# Patient Record
Sex: Male | Born: 1970 | Race: White | Hispanic: No | Marital: Married | Smoking: Never smoker
Health system: Southern US, Community
[De-identification: ages and names within clinical notes are randomized; demographics above are authoritative.]

---

## 2013-07-17 ENCOUNTER — Ambulatory Visit (INDEPENDENT_AMBULATORY_CARE_PROVIDER_SITE_OTHER): Payer: 59 | Admitting: Sports Medicine

## 2013-07-17 ENCOUNTER — Encounter: Payer: Self-pay | Admitting: Sports Medicine

## 2013-07-17 VITALS — BP 116/66 | Ht 71.0 in | Wt 155.0 lb

## 2013-07-17 DIAGNOSIS — M629 Disorder of muscle, unspecified: Secondary | ICD-10-CM

## 2013-07-17 DIAGNOSIS — M7632 Iliotibial band syndrome, left leg: Secondary | ICD-10-CM

## 2013-07-17 NOTE — Patient Instructions (Signed)
  It was nice meeting you.  You need to increase your hip abductor strength. Do 3 sets of 30 reps daily. When that gets easy, start adding ankle weights.  You may benefit from some iontophoresis(discuss this with your friend)  Start performing line drills a couple times a week running about 1 mile each time  See me again in 4 weeks

## 2013-07-18 NOTE — Progress Notes (Signed)
   Subjective:    Patient ID: Nathan Gibson, male    DOB: 06/01/1971, 43 y.o.   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 No injury that he can recall but rather sudden onset of sharp pain. Since that time he has had intermittent pain particularly with running. He states that he is able to get about three quarters of a mile into a run before his pain returns. It improves with relative rest. He has been working with a friend of his, who is a physical therapist, on some quad strengthening and IT band stretches. No problems with his knee in the past. In fact he was able to train for an complete a full marathon just before his knee pain started. He denies any swelling. No mechanical symptoms. No prior knee surgeries. Knee does sometimes get stiff with sitting.  Otherwise healthy. No known drug allergies. Takes no chronic medications.    Review of Systems     Objective:   Physical Exam Well-developed, fit-appearing. No acute distress.  Left knee: Full range of motion. No effusion. No patellofemoral crepitus. There is tenderness to palpation along the distal IT band with hip abductor weakness against resistance. No joint line tenderness to palpation. Negative McMurray's. Negative Thessaly's. Knee is stable to valgus and varus stressing. Negative anterior drawer, negative posterior drawer. No leg length discrepancy. Neurovascularly intact distally.  Evaluation of his running form shows him to slightly externally rotate his foot on foot strike. He is running without a limp.       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 I think he would benefit from some iontophoresis and he will discuss this with his physical therapy friend. I also want him to start performing some line  drills to help with his running form. He will do these twice weekly. He is okay to continue running as symptoms allow but he may need to cross train for the next several weeks if his pain is severe. Followup with me in 4 weeks for reevaluation.

## 2013-08-19 ENCOUNTER — Encounter: Payer: Self-pay | Admitting: Sports Medicine

## 2013-08-19 ENCOUNTER — Ambulatory Visit (INDEPENDENT_AMBULATORY_CARE_PROVIDER_SITE_OTHER): Payer: 59 | Admitting: Sports Medicine

## 2013-08-19 VITALS — BP 101/63 | Ht 71.0 in | Wt 160.0 lb

## 2013-08-19 DIAGNOSIS — M7630 Iliotibial band syndrome, unspecified leg: Secondary | ICD-10-CM

## 2013-08-19 DIAGNOSIS — M629 Disorder of muscle, unspecified: Secondary | ICD-10-CM

## 2013-08-19 NOTE — Progress Notes (Signed)
Patient ID: Nathan ChuRobert JAY Gibson, male   DOB: 06/04/1971, 43 y.o.   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  He has been doing hip abductor strengthening exercises almost daily.  When he forgets to do them and gets knee pain, he remembers to stop and doing the exercises and the pain quickly subsides.  He has not been doing IT band stretches, because he lost the paper that shows them.  He has been doing line drills intermittently.  He reports that when out for a run if his L knee starts to hurt, he will seek out a line to run on and this makes the pain subside almost instantaneously.  He reports that he is ~85-90% better from last time.  He is running a half marathon this weekend and a full marathon in May.  PE: Left knee: Full range of motion. No effusion. No TTP over IT band.  Some tightness in IT band remains.  Hip abductor strength is improved with 4+/5 strength in L hip abductors.  Patient still everts feet bilaterally while running. Walking and running without a limp.  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

## 2016-01-13 ENCOUNTER — Ambulatory Visit (INDEPENDENT_AMBULATORY_CARE_PROVIDER_SITE_OTHER): Payer: 59 | Admitting: Sports Medicine

## 2016-01-13 ENCOUNTER — Encounter: Payer: Self-pay | Admitting: Sports Medicine

## 2016-01-13 VITALS — BP 110/60 | Ht 71.0 in | Wt 155.0 lb

## 2016-01-13 DIAGNOSIS — M7662 Achilles tendinitis, left leg: Secondary | ICD-10-CM | POA: Diagnosis not present

## 2016-01-13 DIAGNOSIS — M79673 Pain in unspecified foot: Secondary | ICD-10-CM

## 2016-01-13 NOTE — Progress Notes (Signed)
   Subjective:    Patient ID: Nathan Gibson, male    DOB: 06/09/1971, 45 y.o.   MRN: [id number]  HPI chief complaint: Left Achilles pain  Patient comes in today complaining of 2 months of left Achilles pain. He does not recall a specific injury that injured the Achilles tendon but he did roll his ankle 2-3 days prior to noticing pain. His pain is worse with running, particularly up and down hills. He has noticed a mild amount of swelling. He localizes all of his pain to the Achilles. He has been able to run but decided to take a few days off to see if it would help his pain and he found that it did help somewhat. He has also been given a tube of Voltaren gel by a friend and is asking whether or not that may be helpful. He denies any problems with his Achilles in the past. He denies any numbness or tingling. No pain at rest. He typically runs in a zero drop shoe but he has recently purchased a regular well cushioned running shoe has more of a heel lift.  Interim medical history reviewed Medications reviewed Allergies reviewed    Review of Systems As above    Objective:   Physical Exam  Well-developed, fit appearing. No acute distress. Awake alert and oriented 3. Vital signs reviewed  Left Achilles: He is tender to palpation along the mid substance of the tendon. There is no swelling. No tenderness to palpation anterior to the Achilles tendon nor at the calcaneus. Some mild pain with Achilles stretch. Neurovascular intact distally. Walking without a limp.  MSK ultrasound of the left Achilles was performed. There is some mild thickening but no obvious tearing. No significant fluid.      Assessment & Plan:   Left Achilles strain  Patient will avoid his zero drop tennis shoes until he has made a full recovery. I did explain to him that this type of running shoe may place him at a risk of reinjury. I've given him a pair of 3/16 inch left to put in his new running shoes and he is okay to  run using pain as his guide but he will avoid hills as much as possible. I have given him some Alfredson heel drop exercises to start doing daily. He may use his topical Voltaren as needed. He understands that it may be 6-8 weeks before complete recovery. As long as that the case, he may follow-up with me as needed.

## 2016-09-24 DIAGNOSIS — L237 Allergic contact dermatitis due to plants, except food: Secondary | ICD-10-CM | POA: Diagnosis not present

## 2017-07-10 DIAGNOSIS — J069 Acute upper respiratory infection, unspecified: Secondary | ICD-10-CM | POA: Diagnosis not present

## 2017-08-08 DIAGNOSIS — R509 Fever, unspecified: Secondary | ICD-10-CM | POA: Diagnosis not present

## 2017-08-08 DIAGNOSIS — R070 Pain in throat: Secondary | ICD-10-CM | POA: Diagnosis not present

## 2017-08-08 DIAGNOSIS — R52 Pain, unspecified: Secondary | ICD-10-CM | POA: Diagnosis not present

## 2017-08-08 DIAGNOSIS — J329 Chronic sinusitis, unspecified: Secondary | ICD-10-CM | POA: Diagnosis not present

## 2017-12-11 ENCOUNTER — Encounter

## 2017-12-11 ENCOUNTER — Ambulatory Visit (INDEPENDENT_AMBULATORY_CARE_PROVIDER_SITE_OTHER): Payer: BC Managed Care – PPO | Admitting: Sports Medicine

## 2017-12-11 VITALS — BP 108/68 | Ht 71.0 in | Wt 158.0 lb

## 2017-12-11 DIAGNOSIS — M25562 Pain in left knee: Secondary | ICD-10-CM
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
## 2018-01-09 ENCOUNTER — Ambulatory Visit (INDEPENDENT_AMBULATORY_CARE_PROVIDER_SITE_OTHER): Payer: BLUE CROSS/BLUE SHIELD | Admitting: Sports Medicine

## 2018-01-09 ENCOUNTER — Ambulatory Visit
Admission: RE | Admit: 2018-01-09 | Discharge: 2018-01-09 | Disposition: A | Discharge status: Home / Self Care | Payer: BLUE CROSS/BLUE SHIELD | Source: Ambulatory Visit | Attending: Sports Medicine | Admitting: Sports Medicine

## 2018-01-09 VITALS — BP 102/68 | Ht 71.0 in | Wt 158.0 lb

## 2018-01-09 DIAGNOSIS — M25562 Pain in left knee: Secondary | ICD-10-CM | POA: Diagnosis not present

## 2018-01-09 DIAGNOSIS — M79652 Pain in left thigh: Secondary | ICD-10-CM | POA: Diagnosis not present
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
## 2020-06-29 DIAGNOSIS — Z23 Encounter for immunization: Secondary | ICD-10-CM | POA: Diagnosis not present

## 2021-02-15 DIAGNOSIS — L821 Other seborrheic keratosis: Secondary | ICD-10-CM | POA: Diagnosis not present

## 2021-02-15 DIAGNOSIS — L648 Other androgenic alopecia: Secondary | ICD-10-CM | POA: Diagnosis not present

## 2021-09-03 DIAGNOSIS — Z6822 Body mass index (BMI) 22.0-22.9, adult: Secondary | ICD-10-CM | POA: Diagnosis not present

## 2021-09-03 DIAGNOSIS — Z0000 Encounter for general adult medical examination without abnormal findings: Secondary | ICD-10-CM | POA: Diagnosis not present

## 2021-09-24 DIAGNOSIS — Z0000 Encounter for general adult medical examination without abnormal findings: Secondary | ICD-10-CM | POA: Diagnosis not present

## 2021-09-24 DIAGNOSIS — Z13220 Encounter for screening for lipoid disorders: Secondary | ICD-10-CM | POA: Diagnosis not present

## 2021-10-07 ENCOUNTER — Other Ambulatory Visit: Payer: Self-pay

## 2021-10-07 DIAGNOSIS — M25511 Pain in right shoulder: Secondary | ICD-10-CM

## 2021-10-18 ENCOUNTER — Ambulatory Visit (INDEPENDENT_AMBULATORY_CARE_PROVIDER_SITE_OTHER): Payer: BC Managed Care – PPO | Admitting: Sports Medicine

## 2021-10-18 VITALS — BP 118/70 | Ht 71.0 in | Wt 158.0 lb

## 2021-10-18 DIAGNOSIS — S29011A Strain of muscle and tendon of front wall of thorax, initial encounter: Secondary | ICD-10-CM
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
## 2021-11-05 DIAGNOSIS — S46219A Strain of muscle, fascia and tendon of other parts of biceps, unspecified arm, initial encounter: Secondary | ICD-10-CM | POA: Diagnosis not present

## 2021-11-05 DIAGNOSIS — M79603 Pain in arm, unspecified: Secondary | ICD-10-CM | POA: Diagnosis not present

## 2021-11-05 DIAGNOSIS — M79601 Pain in right arm: Secondary | ICD-10-CM | POA: Diagnosis not present

## 2021-11-10 DIAGNOSIS — M79601 Pain in right arm: Secondary | ICD-10-CM | POA: Diagnosis not present

## 2021-11-10 DIAGNOSIS — M79621 Pain in right upper arm: Secondary | ICD-10-CM | POA: Diagnosis not present

## 2022-01-13 ENCOUNTER — Ambulatory Visit (INDEPENDENT_AMBULATORY_CARE_PROVIDER_SITE_OTHER): Payer: BC Managed Care – PPO | Admitting: Sports Medicine

## 2022-01-13 VITALS — BP 122/70 | Ht 71.0 in | Wt 157.0 lb

## 2022-01-13 DIAGNOSIS — M79621 Pain in right upper arm: Secondary | ICD-10-CM
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
## 2022-01-25 DIAGNOSIS — M79621 Pain in right upper arm: Secondary | ICD-10-CM | POA: Diagnosis not present

## 2022-01-25 DIAGNOSIS — R600 Localized edema: Secondary | ICD-10-CM | POA: Diagnosis not present

## 2022-02-01 ENCOUNTER — Encounter: Payer: Self-pay | Admitting: Sports Medicine

## 2022-02-01 ENCOUNTER — Telehealth: Payer: Self-pay | Admitting: Sports Medicine

## 2022-02-01 NOTE — Telephone Encounter (Signed)
  I spoke with Nathan Gibson on the phone today after reviewing MRI findings of his right humerus.  There is a small calcific nodule at the medial proximal humeral diaphysis adjacent to the latissimus dorsi tendon insertion.  This is near his area of pain.  Otherwise, the MRI is unremarkable.  Based on these findings I recommended that Nathan Gibson try some eccentric latissimus dorsi exercises at home.  I would like for him to follow-up with me again in 4 weeks either via MyChart messaging or an in office visit.  I did offer reassurance that I do not see anything requiring surgery.

## 2022-11-05 DIAGNOSIS — J019 Acute sinusitis, unspecified: Secondary | ICD-10-CM | POA: Diagnosis not present

## 2023-01-16 DIAGNOSIS — Z1211 Encounter for screening for malignant neoplasm of colon: Secondary | ICD-10-CM | POA: Diagnosis not present

## 2023-07-25 DIAGNOSIS — D1801 Hemangioma of skin and subcutaneous tissue: Secondary | ICD-10-CM | POA: Diagnosis not present

## 2023-07-25 DIAGNOSIS — D239 Other benign neoplasm of skin, unspecified: Secondary | ICD-10-CM | POA: Diagnosis not present

## 2023-07-25 DIAGNOSIS — L821 Other seborrheic keratosis: Secondary | ICD-10-CM | POA: Diagnosis not present
